# Patient Record
Sex: Male | Born: 1985 | Race: Black or African American | Hispanic: No | Marital: Single | Smoking: Current every day smoker
Health system: Southern US, Community
[De-identification: ages and names within clinical notes are randomized; demographics above are authoritative.]

---

## 2012-04-11 ENCOUNTER — Emergency Department (HOSPITAL_COMMUNITY)
Admission: EM | Admit: 2012-04-11 | Discharge: 2012-04-11 | Disposition: A | Discharge status: Home / Self Care | Payer: No Typology Code available for payment source | Attending: Emergency Medicine | Admitting: Emergency Medicine

## 2012-04-11 ENCOUNTER — Emergency Department (HOSPITAL_COMMUNITY): Payer: No Typology Code available for payment source

## 2012-04-11 ENCOUNTER — Encounter (HOSPITAL_COMMUNITY): Payer: Self-pay | Admitting: *Deleted

## 2012-04-11 DIAGNOSIS — S335XXA Sprain of ligaments of lumbar spine, initial encounter: Secondary | ICD-10-CM | POA: Insufficient documentation

## 2012-04-11 DIAGNOSIS — Y9389 Activity, other specified: Secondary | ICD-10-CM | POA: Insufficient documentation

## 2012-04-11 DIAGNOSIS — S43402A Unspecified sprain of left shoulder joint, initial encounter: Secondary | ICD-10-CM

## 2012-04-11 DIAGNOSIS — S139XXA Sprain of joints and ligaments of unspecified parts of neck, initial encounter: Secondary | ICD-10-CM | POA: Insufficient documentation

## 2012-04-11 DIAGNOSIS — S8392XA Sprain of unspecified site of left knee, initial encounter: Secondary | ICD-10-CM

## 2012-04-11 DIAGNOSIS — S43401A Unspecified sprain of right shoulder joint, initial encounter: Secondary | ICD-10-CM

## 2012-04-11 DIAGNOSIS — S39012A Strain of muscle, fascia and tendon of lower back, initial encounter: Secondary | ICD-10-CM

## 2012-04-11 DIAGNOSIS — IMO0002 Reserved for concepts with insufficient information to code with codable children: Secondary | ICD-10-CM | POA: Insufficient documentation

## 2012-04-11 DIAGNOSIS — R209 Unspecified disturbances of skin sensation: Secondary | ICD-10-CM | POA: Insufficient documentation

## 2012-04-11 DIAGNOSIS — S161XXA Strain of muscle, fascia and tendon at neck level, initial encounter: Secondary | ICD-10-CM

## 2012-04-11 DIAGNOSIS — Y92410 Unspecified street and highway as the place of occurrence of the external cause: Secondary | ICD-10-CM | POA: Insufficient documentation

## 2012-04-11 MED ORDER — HYDROCODONE-ACETAMINOPHEN 5-325 MG PO TABS: 1.0000 | ORAL_TABLET | Freq: Four times a day (QID) | ORAL | Status: DC | PRN

## 2012-04-11 MED ORDER — CYCLOBENZAPRINE HCL 10 MG PO TABS: 10.0000 mg | ORAL_TABLET | Freq: Two times a day (BID) | ORAL | Status: DC | PRN

## 2012-04-11 MED ORDER — MORPHINE SULFATE 4 MG/ML IJ SOLN
4.0000 mg | Freq: Once | INTRAMUSCULAR | Status: AC
  Administered 2012-04-11: 4 mg via INTRAVENOUS
  Filled 2012-04-11: qty 1

## 2012-04-11 MED ORDER — IBUPROFEN 600 MG PO TABS: 600.0000 mg | ORAL_TABLET | Freq: Four times a day (QID) | ORAL | Status: DC | PRN

## 2012-04-11 MED ORDER — ONDANSETRON HCL 4 MG/2ML IJ SOLN
4.0000 mg | Freq: Once | INTRAMUSCULAR | Status: AC
  Administered 2012-04-11: 4 mg via INTRAVENOUS
  Filled 2012-04-11: qty 2

## 2012-04-11 NOTE — ED Notes (Signed)
Pt transported to MRI 

## 2012-04-11 NOTE — ED Notes (Signed)
MRI reported wait will be 3 hours. Pt will be alerted
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
## 2012-04-11 NOTE — ED Notes (Signed)
Pt in XR. 
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
## 2012-04-13 NOTE — ED Provider Notes (Signed)
Medical screening examination/treatment/procedure(s) were performed by non-physician practitioner and as supervising physician I was immediately available for consultation/collaboration.  Baldo Hufnagle R. Harutyun Monteverde, MD 04/13/12 0004 

## 2012-04-16 NOTE — ED Provider Notes (Signed)
Medical screening examination/treatment/procedure(s) were performed by non-physician practitioner and as supervising physician I was immediately available for consultation/collaboration.  Juliet Rude. Rubin Payor, MD 04/16/12 [phone]
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
## 2013-12-31 ENCOUNTER — Emergency Department (HOSPITAL_BASED_OUTPATIENT_CLINIC_OR_DEPARTMENT_OTHER)
Admission: EM | Admit: 2013-12-31 | Discharge: 2013-12-31 | Disposition: A | Discharge status: Home / Self Care | Payer: No Typology Code available for payment source | Attending: Emergency Medicine | Admitting: Emergency Medicine

## 2013-12-31 ENCOUNTER — Encounter (HOSPITAL_BASED_OUTPATIENT_CLINIC_OR_DEPARTMENT_OTHER): Payer: Self-pay | Admitting: Emergency Medicine

## 2013-12-31 DIAGNOSIS — A64 Unspecified sexually transmitted disease: Secondary | ICD-10-CM | POA: Insufficient documentation

## 2013-12-31 MED ORDER — CEFTRIAXONE SODIUM 250 MG IJ SOLR
250.0000 mg | Freq: Once | INTRAMUSCULAR | Status: AC
  Administered 2013-12-31: 250 mg via INTRAMUSCULAR

## 2013-12-31 MED ORDER — AZITHROMYCIN 1 G PO PACK
PACK | ORAL | Status: AC
  Filled 2013-12-31: qty 1

## 2013-12-31 MED ORDER — METRONIDAZOLE 500 MG PO TABS
ORAL_TABLET | ORAL | Status: AC
  Filled 2013-12-31: qty 4

## 2013-12-31 MED ORDER — CEFTRIAXONE SODIUM 250 MG IJ SOLR
INTRAMUSCULAR | Status: AC
  Filled 2013-12-31: qty 250

## 2013-12-31 MED ORDER — METRONIDAZOLE 500 MG PO TABS
2000.0000 mg | ORAL_TABLET | Freq: Once | ORAL | Status: AC
  Administered 2013-12-31: 2000 mg via ORAL

## 2013-12-31 MED ORDER — AZITHROMYCIN 1 G PO PACK
1.0000 g | PACK | Freq: Once | ORAL | Status: AC
  Administered 2013-12-31: 1 g via ORAL

## 2013-12-31 MED ORDER — LIDOCAINE HCL (PF) 1 % IJ SOLN
INTRAMUSCULAR | Status: AC
  Administered 2013-12-31: 5 mL
  Filled 2013-12-31: qty 5

## 2013-12-31 NOTE — Discharge Instructions (Signed)
Sexually Transmitted Disease A sexually transmitted disease (STD) is a disease or infection often passed to another person during sex. However, STDs can be passed through nonsexual ways. An STD can be passed through:  Spit (saliva).  Semen.  Blood.  Mucus from the vagina.  Pee (urine). HOW CAN I LESSEN MY CHANCES OF GETTING AN STD?  Use:  Latex condoms.  Water-soluble lubricants with condoms. Do not use petroleum jelly or oils.  Dental dams. These are small pieces of latex that are used as a barrier during oral sex.  Avoid having more than one sex partner.  Do not have sex with someone who has other sex partners.  Do not have sex with anyone you do not know or who is at high risk for an STD.  Avoid risky sex that can break your skin.  Do not have sex if you have open sores on your mouth or skin.  Avoid drinking too much alcohol or taking illegal drugs. Alcohol and drugs can affect your good judgment.  Avoid oral and anal sex acts.  Get shots (vaccines) for HPV and hepatitis.  If you are at risk of being infected with HIV, it is advised that you take a certain medicine daily to prevent HIV infection. This is called pre-exposure prophylaxis (PrEP). You may be at risk if:  You are a man who has sex with other men (MSM).  You are attracted to the opposite sex (heterosexual) and are having sex with more than one partner.  You take drugs with a needle.  You have sex with someone who has HIV.  Talk with your doctor about if you are at high risk of being infected with HIV. If you begin to take PrEP, get tested for HIV first. Get tested every 3 months for as long as you are taking PrEP. WHAT SHOULD I DO IF I THINK I HAVE AN STD?  See your doctor.  Tell your sex partner(s) that you have an STD. They should be tested and treated.  Do not have sex until your doctor says it is okay. WHEN SHOULD I GET HELP? Get help right away if:  You have bad belly (abdominal)  pain.  You are a man and have puffiness (swelling) or pain in your testicles.  You are a woman and have puffiness in your vagina. Document Released: 02/18/2004 Document Revised: 01/15/2013 Document Reviewed: 07/06/2012 Exit[employer] Patient Information 2015 [employer]. This information is not intended to replace advice given to you by your health care provider. Make sure you discuss any questions you have with your health care provider.  

## 2013-12-31 NOTE — ED Notes (Signed)
Penile discharge x 3 days

## 2013-12-31 NOTE — ED Provider Notes (Signed)
CSN: [id number]     Arrival date & time 12/31/13  0157 History   First MD Initiated Contact with Patient 12/31/13 0206     Chief Complaint  Patient presents with  . Penile Discharge     (Consider location/radiation/quality/duration/timing/severity/associated sxs/prior Treatment) Patient is a 28 y.o. male presenting with penile discharge. The history is provided by the patient.  Penile Discharge This is a new problem. The current episode started more than 2 days ago. The problem occurs constantly. The problem has not changed since onset.Pertinent negatives include no chest pain, no abdominal pain, no headaches and no shortness of breath. Nothing aggravates the symptoms. Nothing relieves the symptoms. He has tried nothing for the symptoms. The treatment provided no relief.  No partners with STI  History reviewed. No pertinent past medical history. No past surgical history on file. No family history on file. History  Substance Use Topics  . Smoking status: Not on file  . Smokeless tobacco: Not on file  . Alcohol Use: Not on file    Review of Systems  Respiratory: Negative for shortness of breath.   Cardiovascular: Negative for chest pain.  Gastrointestinal: Negative for abdominal pain.  Genitourinary: Positive for discharge.  Neurological: Negative for headaches.  All other systems reviewed and are negative.     Allergies  Review of patient's allergies indicates no known allergies.  Home Medications   Prior to Admission medications   Not on File   BP 122/59 mmHg  Pulse 98  Temp(Src) 99.4 F (37.4 C) (Oral)  Resp 18  Ht 5\' 11"  (1.803 m)  Wt 156 lb (70.761 kg)  BMI 21.77 kg/m2  SpO2 99% Physical Exam  Constitutional: He is oriented to person, place, and time. He appears well-developed and well-nourished. No distress.  HENT:  Head: Normocephalic and atraumatic.  Eyes: Conjunctivae and EOM are normal.  Neck: Normal range of motion. Neck supple.  Cardiovascular:  Normal rate, regular rhythm and intact distal pulses.   Pulmonary/Chest: Effort normal and breath sounds normal. He has no wheezes. He has no rales.  Abdominal: Soft. Bowel sounds are normal. There is no tenderness.  Genitourinary:  Green copious discharge chaperone present  Musculoskeletal: Normal range of motion.  Neurological: He is alert and oriented to person, place, and time.  Skin: Skin is warm and dry.  Psychiatric: He has a normal mood and affect.    ED Course  Procedures (including critical care time) Labs Review Labs Reviewed  GC/CHLAMYDIA PROBE AMP    Imaging Review No results found.   EKG Interpretation None      MDM   Final diagnoses:  None   Medications  cefTRIAXone (ROCEPHIN) injection 250 mg (not administered)  azithromycin (ZITHROMAX) powder 1 g (not administered)  metroNIDAZOLE (FLAGYL) tablet 2,000 mg (not administered)   No sexual activity of any kind until all partners treated.  [facility] [place] 7 days for recheck    Jayziah Bankhead Smitty CordsK Jamye Balicki-Rasch, MD 12/31/13 0225

## 2014-01-01 LAB — GC/CHLAMYDIA PROBE AMP: CT PROBE, AMP APTIMA: UNDETERMINED

## 2014-06-09 ENCOUNTER — Encounter (HOSPITAL_BASED_OUTPATIENT_CLINIC_OR_DEPARTMENT_OTHER): Payer: Self-pay | Admitting: *Deleted

## 2014-06-09 DIAGNOSIS — Z202 Contact with and (suspected) exposure to infections with a predominantly sexual mode of transmission: Secondary | ICD-10-CM | POA: Insufficient documentation

## 2014-06-09 DIAGNOSIS — Z720 Tobacco use: Secondary | ICD-10-CM | POA: Insufficient documentation

## 2014-06-09 DIAGNOSIS — N342 Other urethritis: Secondary | ICD-10-CM | POA: Insufficient documentation

## 2014-06-09 NOTE — ED Notes (Signed)
Pt c/o penile discharge exposed to a STD

## 2014-06-10 ENCOUNTER — Emergency Department (HOSPITAL_BASED_OUTPATIENT_CLINIC_OR_DEPARTMENT_OTHER)
Admission: EM | Admit: 2014-06-10 | Discharge: 2014-06-10 | Disposition: A | Discharge status: Home / Self Care | Payer: Self-pay | Attending: Emergency Medicine | Admitting: Emergency Medicine

## 2014-06-10 DIAGNOSIS — Z202 Contact with and (suspected) exposure to infections with a predominantly sexual mode of transmission: Secondary | ICD-10-CM

## 2014-06-10 DIAGNOSIS — N342 Other urethritis: Secondary | ICD-10-CM

## 2014-06-10 LAB — URINE MICROSCOPIC-ADD ON

## 2014-06-10 LAB — URINALYSIS, ROUTINE W REFLEX MICROSCOPIC
BILIRUBIN URINE: NEGATIVE
Glucose, UA: NEGATIVE mg/dL
HGB URINE DIPSTICK: NEGATIVE
KETONES UR: NEGATIVE mg/dL
NITRITE: NEGATIVE
Protein, ur: NEGATIVE mg/dL
Specific Gravity, Urine: 1.022 (ref 1.005–1.030)
UROBILINOGEN UA: 1 mg/dL (ref 0.0–1.0)
pH: 7 (ref 5.0–8.0)

## 2014-06-10 LAB — GC/CHLAMYDIA PROBE AMP (~~LOC~~) NOT AT ARMC
Chlamydia: NEGATIVE
NEISSERIA GONORRHEA: POSITIVE — AB

## 2014-06-10 MED ORDER — AZITHROMYCIN 1 G PO PACK
1.0000 g | PACK | Freq: Once | ORAL | Status: AC
  Administered 2014-06-10: 1 g via ORAL
  Filled 2014-06-10: qty 1

## 2014-06-10 MED ORDER — CEFTRIAXONE SODIUM 250 MG IJ SOLR
250.0000 mg | Freq: Once | INTRAMUSCULAR | Status: AC
  Administered 2014-06-10: 250 mg via INTRAMUSCULAR
  Filled 2014-06-10: qty 250

## 2014-06-10 MED ORDER — LIDOCAINE HCL (PF) 1 % IJ SOLN
INTRAMUSCULAR | Status: AC
  Administered 2014-06-10: 1.2 mL
  Filled 2014-06-10: qty 5

## 2014-06-10 NOTE — Discharge Instructions (Signed)
Sexually Transmitted Disease °A sexually transmitted disease (STD) is a disease or infection that may be passed (transmitted) from person to person, usually during sexual activity. This may happen by way of saliva, semen, blood, vaginal mucus, or urine. Common STDs include:  °· Gonorrhea.   °· Chlamydia.   °· Syphilis.   °· HIV and AIDS.   °· Genital herpes.   °· Hepatitis B and C.   °· Trichomonas.   °· Human papillomavirus (HPV).   °· Pubic lice.   °· Scabies. °· Mites. °· Bacterial vaginosis. °WHAT ARE CAUSES OF STDs? °An STD may be caused by bacteria, a virus, or parasites. STDs are often transmitted during sexual activity if one person is infected. However, they may also be transmitted through nonsexual means. STDs may be transmitted after:  °· Sexual intercourse with an infected person.   °· Sharing sex toys with an infected person.   °· Sharing needles with an infected person or using unclean piercing or tattoo needles. °· Having intimate contact with the genitals, mouth, or rectal areas of an infected person.   °· Exposure to infected fluids during birth. °WHAT ARE THE SIGNS AND SYMPTOMS OF STDs? °Different STDs have different symptoms. Some people may not have any symptoms. If symptoms are present, they may include:  °· Painful or bloody urination.   °· Pain in the pelvis, abdomen, vagina, anus, throat, or eyes.   °· A skin rash, itching, or irritation. °· Growths, ulcerations, blisters, or sores in the genital and anal areas. °· Abnormal vaginal discharge with or without bad odor.   °· Penile discharge in men.   °· Fever.   °· Pain or bleeding during sexual intercourse.   °· Swollen glands in the groin area.   °· Yellow skin and eyes (jaundice). This is seen with hepatitis.   °· Swollen testicles. °· Infertility. °· Sores and blisters in the mouth. °HOW ARE STDs DIAGNOSED? °To make a diagnosis, your health care provider may:  °· Take a medical history.   °· Perform a physical exam.   °· Take a sample of  any discharge to examine. °· Swab the throat, cervix, opening to the penis, rectum, or vagina for testing. °· Test a sample of your first morning urine.   °· Perform blood tests.   °· Perform a Pap test, if this applies.   °· Perform a colposcopy.   °· Perform a laparoscopy.   °HOW ARE STDs TREATED? ° Treatment depends on the STD. Some STDs may be treated but not cured.  °· Chlamydia, gonorrhea, trichomonas, and syphilis can be cured with antibiotic medicine.   °· Genital herpes, hepatitis, and HIV can be treated, but not cured, with prescribed medicines. The medicines lessen symptoms.   °· Genital warts from HPV can be treated with medicine or by freezing, burning (electrocautery), or surgery. Warts may come back.   °· HPV cannot be cured with medicine or surgery. However, abnormal areas may be removed from the cervix, vagina, or vulva.   °· If your diagnosis is confirmed, your recent sexual partners need treatment. This is true even if they are symptom-free or have a negative culture or evaluation. They should not have sex until their health care providers say it is okay. °HOW CAN I REDUCE MY RISK OF GETTING AN STD? °Take these steps to reduce your risk of getting an STD: °· Use latex condoms, dental dams, and water-soluble lubricants during sexual activity. Do not use petroleum jelly or oils. °· Avoid having multiple sex partners. °· Do not have sex with someone who has other sex partners. °· Do not have sex with anyone you do not know or who is at   high risk for an STD. °· Avoid risky sex practices that can break your skin. °· Do not have sex if you have open sores on your mouth or skin. °· Avoid drinking too much alcohol or taking illegal drugs. Alcohol and drugs can affect your judgment and put you in a vulnerable position. °· Avoid engaging in oral and anal sex acts. °· Get vaccinated for HPV and hepatitis. If you have not received these vaccines in the past, talk to your health care provider about whether one  or both might be right for you.   °· If you are at risk of being infected with HIV, it is recommended that you take a prescription medicine daily to prevent HIV infection. This is called pre-exposure prophylaxis (PrEP). You are considered at risk if: °¨ You are a man who has sex with other men (MSM). °¨ You are a heterosexual man or woman and are sexually active with more than one partner. °¨ You take drugs by injection. °¨ You are sexually active with a partner who has HIV. °· Talk with your health care provider about whether you are at high risk of being infected with HIV. If you choose to begin PrEP, you should first be tested for HIV. You should then be tested every 3 months for as long as you are taking PrEP.   °WHAT SHOULD I DO IF I THINK I HAVE AN STD? °· See your health care provider.   °· Tell your sexual partner(s). They should be tested and treated for any STDs. °· Do not have sex until your health care provider says it is okay.  °WHEN SHOULD I GET IMMEDIATE MEDICAL CARE? °Contact your health care provider right away if:  °· You have severe abdominal pain. °· You are a man and notice swelling or pain in your testicles. °· You are a woman and notice swelling or pain in your vagina. °Document Released: 04/02/2002 Document Revised: 01/15/2013 Document Reviewed: 07/31/2012 °ExitCare® Patient Information ©2015 [employer]. This information is not intended to replace advice given to you by your health care provider. Make sure you discuss any questions you have with your health care provider. ° ° °Emergency Department Resource Guide °1) Find a Doctor and Pay Out of Pocket °Although you won't have to find out who is covered by your insurance plan, it is a good idea to ask around and get recommendations. You will then need to call the office and see if the doctor you have chosen will accept you as a new patient and what types of options they offer for patients who are self-pay. Some doctors offer discounts or  will set up payment plans for their patients who do not have insurance, but you will need to ask so you aren't surprised when you get to your appointment. ° °2) Contact Your Local Health Department °Not all health departments have doctors that can see patients for sick visits, but many do, so it is worth a call to see if yours does. If you don't know where your local health department is, you can check in your phone book. The CDC also has a tool to help you locate your state's health department, and many state websites also have listings of all of their local health departments. ° °3) Find a Walk-in Clinic °If your illness is not likely to be very severe or complicated, you may want to try a walk in clinic. These are popping up all over the country in pharmacies, drugstores, and shopping centers. They're usually   staffed by nurse practitioners or physician assistants that have been trained to treat common illnesses and complaints. They're usually fairly quick and inexpensive. However, if you have serious medical issues or chronic medical problems, these are probably not your best option. ° °No Primary Care Doctor: °- Call [facility] at  832-8000 - they can help you locate a primary care doctor that  accepts your insurance, provides certain services, etc. °- Physician Referral Service- 1-800-533-3463 ° °Chronic Pain Problems: °Organization         Address  Phone   Notes  °[facility]  (336) 297-2271 Patients need to be referred by their primary care doctor.  ° °Medication Assistance: °Organization         Address  Phone   Notes  °[place] [facility] [address] °[place], [place] [address] (336) 641-8030 --Must be a resident of [place] °-- Must have NO insurance coverage whatsoever (no Medicaid/ Medicare, etc.) °-- The pt. MUST have a primary care doctor that directs their care regularly and follows them in the community °  °[facility]  (866)  331-1348   °[employer]  (888) 892-1162   ° °Agencies that provide inexpensive medical care: °Organization         Address  Phone   Notes  °[facility]  (336) 832-8035   °[facility]    (336) 832-7272   °[facility] [address] °[place], [place] [address] (336) 832-4777   °[facility] [address], °[place] (336) 271-4999   °[facility]    (336) 373-0678   °[facility]    (336) 272-1050   °[facility] ° [address], [place] Phone:  (336) 832-4444, Fax:  (336) 832-4440 Hours of Operation:  9 am - 6 pm, M-F.  Also accepts Medicaid/Medicare and self-pay.  °[facility] ° [address], [place] Phone: (336) 832-3150, Fax: (336) 832-3151. Hours of Operation:  8:30 am - 5:30 pm, M-F.  Also accepts Medicaid and self-pay.  °[facility] [place] [address], [place] Phone: (336) 878-6027   °[facility] [address], [place], [place] (336)723-1848, Ext. 123 Mondays & Thursdays: 7-9 AM.  First 15 patients are seen on a first come, first serve basis. °  ° °Medicaid-accepting [place] Providers: ° °Organization         Address  Phone   Notes  °[facility] [address], [place] (336) 641-2100 Also accepts self-pay patients.  °[facility] [address], [place] ° (336) 856-9996   °[facility] [address], [place] (336) 288-8857   °[facility] [address] [place] [address], [place] (336) 299-7000   °Veita Bland [address], [place]  ° (336) 373-1557 Only accepts [place] Access Medicaid patients after they have their name applied to their card.  ° °Self-Pay (no insurance) in [place]: ° °Organization         Address  Phone   Notes  °Sickle Cell Patients, [facility] [address], [place] (336) 832-1970   °[facility] [address],  (336) 832-4400   ° [facility] ° [address], [place] (336) 992-4800   °[facility]/Dr. Osei-Bonsu ° [address] [place] [address], [place] or [address], [place] (336) 841-8500 Phone number for both [place] and [place] locations is the same.  °[facility] [address], [place] (336) 299-0000   °[facility] [address] [place] [address], [place] or [address] (336) 852-7530 °(336) 878-2260   °[facility] [address], [place] (336) 350-1642, phone; (336) 294-5005, fax Sees patients 1st and 3rd Saturday of every month.  Must not qualify for public or private insurance (i.e. Medicaid, Medicare, [place] Health Choice, Veterans' Benefits) • Household income should be no more than 200% of the poverty level •The clinic cannot treat you if you are pregnant or think you are pregnant • Sexually transmitted diseases are not treated at the clinic.  ° ° °Dental Care: °Organization         Address  Phone  Notes  °[facility] [address], [place] (336) 641-6152 Accepts children up to age 21 who are enrolled in Medicaid or [place] Health Choice; pregnant women with a Medicaid card; and children who have applied for Medicaid or [place] Health Choice, but were declined, whose parents can pay a reduced fee at time of service.  °[facility] [place]  [address], [place] (336) 641-7733 Accepts children up to age 21 who are enrolled in Medicaid or [place] Health Choice; pregnant women with a Medicaid card; and children who have applied for Medicaid or [place] Health Choice, but were declined, whose parents can pay a reduced fee at time of service.  °[facility] ° [address],  (336)  641-4533 Patients are seen by appointment only. Walk-ins are not accepted. [facility] will see patients 18 years of age and older. °Monday - Tuesday (8am-5pm) °Most Wednesdays (8:30-5pm) °$30 per visit, cash only  °[facility] ° [address], [place] (336) 641-4533 Patients are seen by appointment only. Walk-ins are not accepted. [facility] will see patients 18 years of age and older. °One Wednesday Evening (Monthly: Volunteer Based).  $30 per visit, cash only  °[facility]  (919) 537-3737 for adults; Children under age 4, call Graduate Pediatric Dentistry at (919) [phone]. Children aged 4-14, please call (919) [phone] to request a pediatric application. ° Dental services are provided in all areas of dental care including fillings, crowns and bridges, complete and partial dentures, implants, gum treatment, root canals, and extractions. Preventive care is also provided. Treatment is provided to both adults and children. °Patients are selected via a lottery and there is often a waiting list. °  °[facility] [address], ° ° (336) 763-8833 [email or web address] °  °[facility] [address], [place], [place] (336)723-1848, Ext. 123 Second and Fourth Thursday of each month, opens at 6:30 AM; Clinic ends at 9 AM.  Patients are seen on a first-come first-served basis, and a limited number are seen during each clinic.  ° °[facility] ° [address], [place], [place] (336) 723-7904   Eligibility Requirements °You must have lived in [place], [place], or [place] counties for at least the last three months. °  You cannot be eligible for state or federal sponsored healthcare insurance, including Veterans Administration, Medicaid, or Medicare. °  You generally cannot be eligible for healthcare insurance through your employer.  °    How to apply: °Eligibility screenings are held every Tuesday and Wednesday afternoon  from 1:00 pm until 4:00 pm. You do not need an appointment for the interview!  °[facility] [address], [place], [place] 336-631-2330   °[facility]  336-342-8273   °[facility]  336-703-3100   °[facility]  336-570-6415   ° °Behavioral Health Resources in the Community: °Intensive Outpatient Programs °Organization         Address  Phone  Notes  °[facility] [address], [place], [place] 336-878-6098   °[facility] [address], [place], [place] 336-832-9800   °[facility] [address], [place], [place] ° 336-882-2125   °[facility] [address],  °[place], [place] 1-800-853-5163 or 336-641-4981   °Substance Abuse Resources °Organization         Address  Phone  Notes  °[facility]  336-882-2125   °[facility]  336-784-9470   °[facility]  336-285-9073   °[facility]  336-845-3988   °Residential & Outpatient Substance Abuse Program  1-800-659-3381   °Psychological Services °Organization         Address  Phone  Notes  °[facility]  336- 832-9600   °[facility]  336- 378-7881   °[facility] [address], [place] 1-800-853-5163 or 336-641-4981   ° °Mobile Crisis Teams °Organization         Address  Phone  Notes  °[facility], Mobile Crisis Care Unit  1-877-626-1772   °Assertive °[facility] ° [address] [place], [place] 336-834-9664   °Sharon DeEsch [address] °[place] 336-554-5454   ° °Self-Help/Support Groups °Organization         Address  Phone             Notes  °[facility] [place] - variety of support groups  336- 373-1402 Call for more information  °Narcotics Anonymous (NA), [facility] [address], °[place]  2 meetings at this location  ° °Residential Treatment  Programs °Organization         Address  Phone  Notes  °[facility] [address],    °[place]  1-866-801-8205   °[facility] ° [address], [place], [place] 704-293-8524   °[facility] [address], [place] 336-845-3988 Admissions: 8am-3pm M-F  °[facility] [address]    °[place], [place] 336-841-1104   °[facility] [address], [place], [place] 336-379-7146   °[facility] [address]  °[place], [place] 336-285-9073   °[facility] - Intensive Outpatient [address], [place], [place] 336-852-3033   °[facility] [address]  °[place], [place] 1-877-615-2722 or 336-784-9470   °[facility] [address] , [place] 336-227-7417 Accepts Medicaid  °[facility] [address]  ° [place] 1-800-659-3381 Substance Abuse/Addiction Treatment  ° °[place] Behavioral Health Resources °Organization         Address  Phone  Notes  °[facility]  (888) 581-9988   °Julie Brannon, PhD [address] [place],    (336) 349-5553 or (336) 951-0000   °[facility]   [address] °[place],  (336) 349-4454   °[facility] [address], [place], [place] (336) 342-8316 Insurance/Medicaid/sponsorship through [facility]  °[facility] [address]                                    [place], [place] (336) 342-8316 Therapy/tele-psych/case  °[facility] [address]  ° [place], [place] (336) 349-2233    °Dr. Arfeen  (336) 349-4544   °[facility] [place]  [employer] [place] [facility] 1) [address], [place] °2) [address], [place] °3)  [address], [place] (336) 349-3220 °(336) 342-7768 ° °(336) 342-8140   °[place] [facility] (336) 342-1394 or (336) 342-3537 (After Hours)    ° ° ° °

## 2014-06-10 NOTE — ED Provider Notes (Signed)
CSN: [id number]     Arrival date & time 06/09/14  2158 History   First MD Initiated Contact with Patient 06/10/14 0051     Chief Complaint  Patient presents with  . Penile Discharge     (Consider location/radiation/quality/duration/timing/severity/associated sxs/prior Treatment) HPI Gregory Morrow is a 29 year old male with no past medical history reveals the ER complaining of an exposure to an STD as well as penile discharge. Patient reports having new sexual partner, found out that his sexual partner had gonorrhea 4 days ago. Patient states approximately 3 days ago he began having penile discharge, burning sensation with urination. Patient denies any abdominal pain, fever, nausea, vomiting, testicular pain, scrotal pain or swelling.  History reviewed. No pertinent past medical history. History reviewed. No pertinent past surgical history. History reviewed. No pertinent family history. History  Substance Use Topics  . Smoking status: Current Every Day Smoker    Types: Cigarettes, Cigars  . Smokeless tobacco: Not on file  . Alcohol Use: No    Review of Systems  Constitutional: Negative for fever.  HENT: Negative for trouble swallowing.   Eyes: Negative for visual disturbance.  Respiratory: Negative for shortness of breath.   Cardiovascular: Negative for chest pain.  Gastrointestinal: Negative for nausea, vomiting and abdominal pain.  Genitourinary: Positive for dysuria and discharge.  Musculoskeletal: Negative for neck pain.  Skin: Negative for rash.  Neurological: Negative for dizziness, weakness and numbness.  Psychiatric/Behavioral: Negative.       Allergies  Review of patient's allergies indicates no known allergies.  Home Medications   Prior to Admission medications   Not on File   BP 125/79 mmHg  Pulse 99  Temp(Src) 99 F (37.2 C) (Oral)  Resp 16  Wt 156 lb (70.761 kg)  SpO2 100% Physical Exam  Constitutional: He is oriented to person, place, and time. He  appears well-developed and well-nourished. No distress.  HENT:  Head: Normocephalic and atraumatic.  Eyes: Right eye exhibits no discharge. Left eye exhibits no discharge. No scleral icterus.  Neck: Normal range of motion.  Pulmonary/Chest: Effort normal. No respiratory distress.  Abdominal: Hernia confirmed negative in the right inguinal area and confirmed negative in the left inguinal area.  Genitourinary: Testes normal. Cremasteric reflex is present. Right testis shows no mass, no swelling and no tenderness. Right testis is descended. Cremasteric reflex is not absent on the right side. Left testis shows no mass, no swelling and no tenderness. Left testis is descended. Cremasteric reflex is not absent on the left side. Uncircumcised. No phimosis, paraphimosis, hypospadias, penile erythema or penile tenderness. Discharge found.  Moderate amount of yellow/green colored urethral discharge. No testicular pain, swelling or tenderness. Chaperone present during entire genitourinary exam.  Musculoskeletal: Normal range of motion.  Neurological: He is alert and oriented to person, place, and time.  Skin: Skin is warm and dry. He is not diaphoretic.  Psychiatric: He has a normal mood and affect.  Nursing note and vitals reviewed.   ED Course  Procedures (including critical care time) Labs Review Labs Reviewed  URINALYSIS, ROUTINE W REFLEX MICROSCOPIC - Abnormal; Notable for the following:    APPearance CLOUDY (*)    Leukocytes, UA LARGE (*)    All other components within normal limits  URINE CULTURE  URINE MICROSCOPIC-ADD ON  GC/CHLAMYDIA PROBE AMP ([place])    Imaging Review No results found.   EKG Interpretation None      MDM   Final diagnoses:  Exposure to STD  Urethritis  Patient here with known exposure to STD. Patient also having penile discharge and signs and symptoms of urethritis. We'll send GC Chlamydia culture. Patient treated with azithromycin and Rocephin here.  Strongly encouraged patient to undergo further STD testing including RPR, HIV, patient [facility]. Encouraged patient to abstain from intercourse over the next 48 hours until results return. Discussed return precautions with patient, patient verbalizes understanding and agreement of this plan. Encouraged patient to call or return to the ER if any worsening of symptoms or should he have any questions or concerns.  BP 125/79 mmHg  Pulse 99  Temp(Src) 99 F (37.2 C) (Oral)  Resp 16  Wt 156 lb (70.761 kg)  SpO2 100%  Signed,  Ladona MowJoe Jordie Skalsky, PA-C 1:21 AM    Ladona MowJoe Jamauri Kruzel, PA-C 06/10/14 0121  Paula LibraJohn Molpus, MD 06/10/14 [phone]

## 2014-06-11 LAB — URINE CULTURE
CULTURE: NO GROWTH
Colony Count: NO GROWTH

## 2019-03-17 ENCOUNTER — Encounter (HOSPITAL_BASED_OUTPATIENT_CLINIC_OR_DEPARTMENT_OTHER): Payer: Self-pay | Admitting: Emergency Medicine

## 2019-03-17 ENCOUNTER — Other Ambulatory Visit: Payer: Self-pay

## 2019-03-17 ENCOUNTER — Emergency Department (HOSPITAL_BASED_OUTPATIENT_CLINIC_OR_DEPARTMENT_OTHER)
Admission: EM | Admit: 2019-03-17 | Discharge: 2019-03-17 | Disposition: A | Discharge status: Home / Self Care | Payer: Self-pay | Attending: Emergency Medicine | Admitting: Emergency Medicine

## 2019-03-17 DIAGNOSIS — K0889 Other specified disorders of teeth and supporting structures: Secondary | ICD-10-CM | POA: Insufficient documentation

## 2019-03-17 DIAGNOSIS — F17210 Nicotine dependence, cigarettes, uncomplicated: Secondary | ICD-10-CM | POA: Insufficient documentation

## 2019-03-17 MED ORDER — AMOXICILLIN 500 MG PO CAPS: 500.0000 mg | ORAL_CAPSULE | Freq: Three times a day (TID) | ORAL | 0 refills | Status: DC

## 2019-03-17 NOTE — Discharge Instructions (Addendum)

## 2019-03-17 NOTE — ED Triage Notes (Signed)
R side dental pain x 3 days.

## 2019-03-17 NOTE — ED Provider Notes (Signed)
[facility] EMERGENCY DEPARTMENT Provider Note   CSN: [id number] Arrival date & time: 03/17/19  1338     History Chief Complaint  Patient presents with  . Dental Pain    Gregory Morrow is a 34 y.o. male with no pertinent past medical history who presents today for evaluation of dental pain. He reports that his reports that his wisdom teeth reports that his wisdom teeth have been erupting for "a while" and that he has not gotten them pulled. He states that he has an appointment with a dentist tomorrow however over the past 3 days he has been having pain on the right side with swelling around the wisdom tooth. He states that he is still able to drink without difficulty.  He is able to chew okay using the other side.  He has been taking ibuprofen, last dose was approximately 4 hours ago when he took 600 mg with mild relief. He reports that he has not been having any fevers or neck swelling.   HPI     History reviewed. No pertinent past medical history.  There are no problems to display for this patient.   History reviewed. No pertinent surgical history.     No family history on file.  Social History   Tobacco Use  . Smoking status: Current Every Day Smoker    Types: Cigarettes, Cigars  . Smokeless tobacco: Never Used  Substance Use Topics  . Alcohol use: Yes  . Drug use: No    Home Medications Prior to Admission medications   Medication Sig Start Date End Date Taking? Authorizing Provider  amoxicillin (AMOXIL) 500 MG capsule Take 1 capsule (500 mg total) by mouth 3 (three) times daily. 03/17/19   Cristina Gong, PA-C    Allergies    Patient has no known allergies.  Review of Systems   Review of Systems  Constitutional: Negative for chills and fever.  HENT: Positive for dental problem. Negative for drooling, ear pain, facial swelling, sore throat, trouble swallowing and voice change.   All other systems reviewed and are negative.   Physical  Exam Updated Vital Signs BP 114/72 (BP Location: Left Arm)   Pulse 90   Temp 98.3 F (36.8 C) (Oral)   Resp 18   Ht 5\' 11"  (1.803 m)   Wt 72.6 kg   SpO2 100%   BMI 22.32 kg/m   Physical Exam Vitals and nursing note reviewed.  Constitutional:      General: He is not in acute distress. HENT:     Head: Normocephalic and atraumatic.     Nose: Nose normal.     Mouth/Throat:     Mouth: Mucous membranes are moist.     Dentition: Gingival swelling present. No dental abscesses.     Tongue: Tongue does not deviate from midline.     Pharynx: Oropharynx is clear. Uvula midline. No uvula swelling.     Tonsils: No tonsillar abscesses.     Comments: Right sided mandibular third molar with trace surrounding erythema without a localized area of fluctuance that would be a drainable abscess. Third molars x4 are partially erupted.  Eyes:     Conjunctiva/sclera: Conjunctivae normal.  Cardiovascular:     Rate and Rhythm: Normal rate.  Pulmonary:     Effort: Pulmonary effort is normal. No respiratory distress.  Musculoskeletal:     Cervical back: Normal range of motion. No rigidity.  Lymphadenopathy:     Cervical: No cervical adenopathy.  Skin:  General: Skin is warm and dry.  Neurological:     General: No focal deficit present.     Mental Status: He is alert.     Comments: Awake and alert, answers questions appropriately.   Psychiatric:        Mood and Affect: Mood normal.        Behavior: Behavior normal.     ED Results / Procedures / Treatments   Labs (all labs ordered are listed, but only abnormal results are displayed) Labs Reviewed - No data to display  EKG None  Radiology No results found.  Procedures Procedures (including critical care time)  Medications Ordered in ED Medications - No data to display  ED Course  I have reviewed the triage vital signs and the nursing notes.  Pertinent labs & imaging results that were available during my care of the patient  were reviewed by me and considered in my medical decision making (see chart for details).    MDM Rules/Calculators/A&P                     Patient presents today for evaluation of 3 days of dental pain. On exam he appears to have erupting wisdom teeth/third molars with mild swelling and tenderness around the right mandibular third molar.  There is no palpable intraoral abscess.  Posterior oropharynx is clear and moist without edema erythema, exudate or uvular deviation.  Phonation is normal and he denies difficulty drinking.  He is managing his secretions without difficulty in the emergency room. Recommended adding in Tylenol for additional pain control.  Given that he has had this eruption however it became acutely painful we will start him on amoxicillin. He has dental follow-up tomorrow and we discussed the importance of keeping this appointment.  Return precautions were discussed with patient who states their understanding.  At the time of discharge patient denied any unaddressed complaints or concerns.  Patient is agreeable for discharge home.  Note: Portions of this report may have been transcribed using voice recognition software. Every effort was made to ensure accuracy; however, inadvertent computerized transcription errors may be present  Final Clinical Impression(s) / ED Diagnoses Final diagnoses:  Pain, dental    Rx / DC Orders ED Discharge Orders         Ordered    amoxicillin (AMOXIL) 500 MG capsule  3 times daily     03/17/19 [place], Dyllan Hughett W, PA-C 03/17/19 [place], Archbold, DO 03/17/19 1518

## 2021-01-05 ENCOUNTER — Other Ambulatory Visit: Payer: Self-pay

## 2021-01-05 ENCOUNTER — Emergency Department (HOSPITAL_BASED_OUTPATIENT_CLINIC_OR_DEPARTMENT_OTHER)
Admission: EM | Admit: 2021-01-05 | Discharge: 2021-01-05 | Disposition: A | Discharge status: Home / Self Care | Payer: Self-pay | Attending: Emergency Medicine | Admitting: Emergency Medicine

## 2021-01-05 ENCOUNTER — Encounter (HOSPITAL_BASED_OUTPATIENT_CLINIC_OR_DEPARTMENT_OTHER): Payer: Self-pay | Admitting: Emergency Medicine

## 2021-01-05 DIAGNOSIS — F17210 Nicotine dependence, cigarettes, uncomplicated: Secondary | ICD-10-CM | POA: Insufficient documentation

## 2021-01-05 DIAGNOSIS — R369 Urethral discharge, unspecified: Secondary | ICD-10-CM | POA: Insufficient documentation

## 2021-01-05 DIAGNOSIS — Z202 Contact with and (suspected) exposure to infections with a predominantly sexual mode of transmission: Secondary | ICD-10-CM | POA: Insufficient documentation

## 2021-01-05 MED ORDER — CEFTRIAXONE SODIUM 500 MG IJ SOLR
500.0000 mg | Freq: Once | INTRAMUSCULAR | Status: AC
  Administered 2021-01-05: 500 mg via INTRAMUSCULAR
  Filled 2021-01-05: qty 500

## 2021-01-05 MED ORDER — DOXYCYCLINE HYCLATE 100 MG PO CAPS: 100.0000 mg | ORAL_CAPSULE | Freq: Two times a day (BID) | ORAL | 0 refills | Status: AC

## 2021-01-05 NOTE — Discharge Instructions (Addendum)
Take doxycycline twice daily for the next 10 days. Establish care with a primary care provider, information provided above. In the future you can get tested free at the health department. Use condoms and practice safe sex.

## 2021-01-05 NOTE — ED Triage Notes (Signed)
Pt reports exposure to chlamydia and white penile discharge. Denies n/v, urinary sx, abdominal pain, fevers.

## 2021-01-05 NOTE — ED Provider Notes (Signed)
[facility] EMERGENCY DEPARTMENT Provider Note   CSN: [id number] Arrival date & time: 01/05/21  1819     History Chief Complaint  Patient presents with   Exposure to STD    Gregory Morrow is a 35 y.o. male.   Exposure to STD   Patient presents with 1 week of penile discharge.  Discharge happens intermittently, its yellow/white in color.  There is no burning with urination, penile pain, testicular pain or swelling.  No new rashes, no abdominal pain or fevers.  Patient reports he had unprotected sex about a week ago, his partner tested positive for chlamydia.  Denies any history of other STDs, no obvious alleviating factors or aggravating factors.  History reviewed. No pertinent past medical history.  There are no problems to display for this patient.   History reviewed. No pertinent surgical history.     No family history on file.  Social History   Tobacco Use   Smoking status: Every Day    Types: Cigarettes, Cigars   Smokeless tobacco: Never  Substance Use Topics   Alcohol use: Yes   Drug use: No    Home Medications Prior to Admission medications   Medication Sig Start Date End Date Taking? Authorizing Provider  amoxicillin (AMOXIL) 500 MG capsule Take 1 capsule (500 mg total) by mouth 3 (three) times daily. 03/17/19   Cristina Gong, PA-C    Allergies    Patient has no known allergies.  Review of Systems   Review of Systems  Constitutional:  Negative for fever.  Genitourinary:  Positive for penile discharge.   Physical Exam Updated Vital Signs BP 116/64 (BP Location: Left Arm)    Pulse 91    Temp 97.7 F (36.5 C) (Oral)    Resp 16    SpO2 98%   Physical Exam Vitals and nursing note reviewed. Exam conducted with a chaperone present.  Constitutional:      General: He is not in acute distress.    Appearance: Normal appearance.  HENT:     Head: Normocephalic and atraumatic.  Eyes:     General: No scleral icterus.       Right eye: No  discharge.        Left eye: No discharge.     Extraocular Movements: Extraocular movements intact.     Pupils: Pupils are equal, round, and reactive to light.  Cardiovascular:     Rate and Rhythm: Normal rate and regular rhythm.     Pulses: Normal pulses.     Heart sounds: Normal heart sounds. No murmur heard.   No friction rub. No gallop.  Pulmonary:     Effort: Pulmonary effort is normal. No respiratory distress.     Breath sounds: Normal breath sounds.  Abdominal:     General: Abdomen is flat. Bowel sounds are normal. There is no distension.     Palpations: Abdomen is soft.     Tenderness: There is no abdominal tenderness.  Skin:    General: Skin is warm and dry.     Coloration: Skin is not jaundiced.  Neurological:     Mental Status: He is alert. Mental status is at baseline.     Coordination: Coordination normal.    ED Results / Procedures / Treatments   Labs (all labs ordered are listed, but only abnormal results are displayed) Labs Reviewed  GC/CHLAMYDIA PROBE AMP ([place]) NOT AT [facility]    EKG None  Radiology No results found.  Procedures Procedures  Medications Ordered in ED Medications  cefTRIAXone (ROCEPHIN) injection 500 mg (has no administration in time range)    ED Course  I have reviewed the triage vital signs and the nursing notes.  Pertinent labs & imaging results that were available during my care of the patient were reviewed by me and considered in my medical decision making (see chart for details).    MDM Rules/Calculators/A&P                           Stable vitals, nontoxic.  Abdomen soft, GC chlamydia collected.  We will treat empirically for STDs.  Do not suspect PID or any emergent condition.  Discharged in stable condition.  Final Clinical Impression(s) / ED Diagnoses Final diagnoses:  Exposure to STD    Rx / DC Orders ED Discharge Orders     None        Theron Arista, Cordelia Poche 01/05/21 1943    Linwood Dibbles, MD 01/06/21  1539

## 2021-01-06 LAB — GC/CHLAMYDIA PROBE AMP (~~LOC~~) NOT AT ARMC
Chlamydia: POSITIVE — AB
Comment: NEGATIVE
Comment: NORMAL
Neisseria Gonorrhea: NEGATIVE
